# Patient Record
Sex: Female | Born: 1960 | Race: White | Hispanic: No | Marital: Married | Smoking: Never smoker
Health system: Southern US, Community
[De-identification: ages and names within clinical notes are randomized; demographics above are authoritative.]

## PROBLEM LIST (undated history)

## (undated) DIAGNOSIS — J309 Allergic rhinitis, unspecified: Secondary | ICD-10-CM

## (undated) DIAGNOSIS — T7840XA Allergy, unspecified, initial encounter: Secondary | ICD-10-CM

## (undated) DIAGNOSIS — E119 Type 2 diabetes mellitus without complications: Secondary | ICD-10-CM

## (undated) HISTORY — DX: Type 2 diabetes mellitus without complications: E11.9

## (undated) HISTORY — DX: Allergic rhinitis, unspecified: J30.9

## (undated) HISTORY — PX: POLYPECTOMY: SHX149

## (undated) HISTORY — PX: COLONOSCOPY: SHX174

## (undated) HISTORY — DX: Allergy, unspecified, initial encounter: T78.40XA

---

## 1999-03-14 ENCOUNTER — Other Ambulatory Visit: Admission: RE | Admit: 1999-03-14 | Discharge: 1999-03-14 | Payer: Self-pay | Admitting: Obstetrics and Gynecology

## 2001-05-10 ENCOUNTER — Other Ambulatory Visit: Admission: RE | Admit: 2001-05-10 | Discharge: 2001-05-10 | Payer: Self-pay | Admitting: Obstetrics and Gynecology

## 2002-06-27 ENCOUNTER — Other Ambulatory Visit: Admission: RE | Admit: 2002-06-27 | Discharge: 2002-06-27 | Payer: Self-pay | Admitting: Obstetrics and Gynecology

## 2003-07-06 ENCOUNTER — Other Ambulatory Visit: Admission: RE | Admit: 2003-07-06 | Discharge: 2003-07-06 | Payer: Self-pay | Admitting: Obstetrics and Gynecology

## 2004-08-14 ENCOUNTER — Other Ambulatory Visit: Admission: RE | Admit: 2004-08-14 | Discharge: 2004-08-14 | Payer: Self-pay | Admitting: Obstetrics and Gynecology

## 2005-09-05 ENCOUNTER — Ambulatory Visit (HOSPITAL_COMMUNITY): Admission: RE | Admit: 2005-09-05 | Discharge: 2005-09-05 | Payer: Self-pay | Admitting: Obstetrics and Gynecology

## 2006-09-07 ENCOUNTER — Ambulatory Visit (HOSPITAL_COMMUNITY): Admission: RE | Admit: 2006-09-07 | Discharge: 2006-09-07 | Payer: Self-pay | Admitting: Obstetrics and Gynecology

## 2007-09-10 ENCOUNTER — Ambulatory Visit (HOSPITAL_COMMUNITY): Admission: RE | Admit: 2007-09-10 | Discharge: 2007-09-10 | Payer: Self-pay | Admitting: Obstetrics and Gynecology

## 2008-09-15 ENCOUNTER — Ambulatory Visit (HOSPITAL_COMMUNITY): Admission: RE | Admit: 2008-09-15 | Discharge: 2008-09-15 | Payer: Self-pay | Admitting: Obstetrics and Gynecology

## 2008-09-19 ENCOUNTER — Encounter: Admission: RE | Admit: 2008-09-19 | Discharge: 2008-09-19 | Payer: Self-pay | Admitting: Obstetrics and Gynecology

## 2013-01-11 ENCOUNTER — Other Ambulatory Visit: Payer: Self-pay | Admitting: Obstetrics and Gynecology

## 2013-01-11 DIAGNOSIS — Z803 Family history of malignant neoplasm of breast: Secondary | ICD-10-CM

## 2015-01-11 ENCOUNTER — Other Ambulatory Visit: Payer: Self-pay | Admitting: Obstetrics and Gynecology

## 2015-01-11 DIAGNOSIS — R928 Other abnormal and inconclusive findings on diagnostic imaging of breast: Secondary | ICD-10-CM

## 2015-01-19 ENCOUNTER — Other Ambulatory Visit: Payer: Self-pay | Admitting: Obstetrics and Gynecology

## 2015-01-19 ENCOUNTER — Ambulatory Visit
Admission: RE | Admit: 2015-01-19 | Discharge: 2015-01-19 | Disposition: A | Discharge status: Home / Self Care | Payer: BLUE CROSS/BLUE SHIELD | Source: Ambulatory Visit | Attending: Obstetrics and Gynecology | Admitting: Obstetrics and Gynecology

## 2015-01-19 DIAGNOSIS — N6310 Unspecified lump in the right breast, unspecified quadrant: Secondary | ICD-10-CM

## 2015-01-19 DIAGNOSIS — R928 Other abnormal and inconclusive findings on diagnostic imaging of breast: Secondary | ICD-10-CM

## 2015-01-22 ENCOUNTER — Other Ambulatory Visit: Payer: Self-pay | Admitting: Obstetrics and Gynecology

## 2015-01-22 ENCOUNTER — Encounter: Payer: Self-pay | Admitting: Gastroenterology

## 2015-01-22 ENCOUNTER — Ambulatory Visit
Admission: RE | Admit: 2015-01-22 | Discharge: 2015-01-22 | Disposition: A | Discharge status: Home / Self Care | Payer: BLUE CROSS/BLUE SHIELD | Source: Ambulatory Visit | Attending: Obstetrics and Gynecology | Admitting: Obstetrics and Gynecology

## 2015-01-22 DIAGNOSIS — N6310 Unspecified lump in the right breast, unspecified quadrant: Secondary | ICD-10-CM

## 2015-02-08 DIAGNOSIS — J309 Allergic rhinitis, unspecified: Principal | ICD-10-CM

## 2015-02-08 DIAGNOSIS — H1010 Acute atopic conjunctivitis, unspecified eye: Secondary | ICD-10-CM | POA: Insufficient documentation

## 2015-08-01 ENCOUNTER — Ambulatory Visit (INDEPENDENT_AMBULATORY_CARE_PROVIDER_SITE_OTHER): Payer: BLUE CROSS/BLUE SHIELD | Admitting: Allergy and Immunology

## 2015-08-01 ENCOUNTER — Encounter: Payer: Self-pay | Admitting: Allergy and Immunology

## 2015-08-01 VITALS — BP 114/68 | HR 84 | Resp 16 | Ht 65.63 in | Wt 141.8 lb

## 2015-08-01 DIAGNOSIS — J309 Allergic rhinitis, unspecified: Secondary | ICD-10-CM

## 2015-08-01 DIAGNOSIS — Z91018 Allergy to other foods: Secondary | ICD-10-CM | POA: Diagnosis not present

## 2015-08-01 DIAGNOSIS — H1010 Acute atopic conjunctivitis, unspecified eye: Secondary | ICD-10-CM

## 2015-08-01 DIAGNOSIS — T781XXD Other adverse food reactions, not elsewhere classified, subsequent encounter: Secondary | ICD-10-CM | POA: Diagnosis not present

## 2015-08-01 MED ORDER — OLOPATADINE HCL 0.7 % OP SOLN: 1.0000 [drp] | Freq: Every day | OPHTHALMIC | Status: DC | PRN

## 2015-08-01 MED ORDER — MONTELUKAST SODIUM 10 MG PO TABS: ORAL_TABLET | ORAL | Status: AC

## 2015-08-01 MED ORDER — EPINEPHRINE 0.3 MG/0.3ML IJ SOAJ: INTRAMUSCULAR | Status: AC

## 2015-08-01 NOTE — Patient Instructions (Signed)
  1. Continue EpiPen if needed  2. Continue montelukast 10 mg one tablet once a day if needed  3. Continue Zyrtec 10 tablet once a day if needed  4. Continue Pazeo one drop each eye once a day if needed  5. Further evaluation?  6. Return to clinic in 1 year or earlier if problem

## 2015-08-01 NOTE — Progress Notes (Signed)
Follow-up Note  Referring Provider: No ref. provider found Primary Provider: Simone CuriaLEE,KEUNG, MD Date of Office Visit: 08/01/2015  Subjective:   Tina Green (DOB: 02-Nov-1960) is a 55 y.o. female [facility] in re-evaluation of the following:  HPI Comments: Tina Green returns to this clinic in reevaluation of her apparent alpha gal syndrome manifested as acute allergic reaction with urticaria and her allergic rhinoconjunctivitis. It is been 1 year since I've seen her in this clinic.  While avoiding mammal inconsistently using Zyrtec and montelukast she is not had any allergic reactions. Her nose and eyes are doing relatively well although on occasion she does develop some irritation of her eyes especially at work. She thinks that this may be secondary to formaldehyde exposure. She is not had a tremendous amount of problems with her nose or eyes regarding pollen exposure. She does relate a history of developing problems with swollen lips and some throat irritation when she eats certain foods like tomatoes and strawberries.  In March 2016 Tina Green had an alpha gal titer checked which was relatively low at 0.67 KU/ML. She [facility] which was less than 1.0 KU/ML.     Medication List           cetirizine 10 MG tablet  Commonly known as:  ZYRTEC  Take 10 mg by mouth daily.     EPIPEN 2-PAK 0.3 mg/0.3 mL Soaj injection  Generic drug:  EPINEPHrine  Inject 0.3 mg into the muscle once.     montelukast 10 MG tablet  Commonly known as:  SINGULAIR  Take 10 mg by mouth daily.     NUTRITIONAL SUPPLEMENTS PO  Take by mouth.        Past Medical History  Diagnosis Date  . Allergic rhinitis     History reviewed. No pertinent past surgical history.  Allergies  Allergen Reactions  . Penicillins     Review of systems negative except as noted in HPI / PMHx or noted below:  Review of  Systems  Constitutional: Negative.   HENT: Negative.   Eyes: Negative.   Respiratory: Negative.   Cardiovascular: Negative.   Gastrointestinal: Negative.   Genitourinary: Negative.   Musculoskeletal: Negative.   Skin: Negative.   Neurological: Negative.   Endo/Heme/Allergies: Negative.   Psychiatric/Behavioral: Negative.      Objective:   Filed Vitals:   08/01/15 1612  BP: 114/68  Pulse: 84  Resp: 16   Height: 5' 5.63" (166.7 cm)  Weight: 141 lb 12.1 oz (64.3 kg)   Physical Exam  Constitutional: She is well-developed, well-nourished, and in no distress.  HENT:  Head: Normocephalic.  Right Ear: Tympanic membrane, external ear and ear canal normal.  Left Ear: Tympanic membrane, external ear and ear canal normal.  Nose: Nose normal. No mucosal edema or rhinorrhea.  Mouth/Throat: Uvula is midline, oropharynx is clear and moist and mucous membranes are normal. No oropharyngeal exudate.  Eyes: Conjunctivae are normal.  Neck: Trachea normal. No tracheal tenderness present. No tracheal deviation present. No thyromegaly present.  Cardiovascular: Normal rate, regular rhythm, S1 normal, S2 normal and normal heart sounds.   No murmur heard. Pulmonary/Chest: Breath sounds normal. No stridor. No respiratory distress. She has no wheezes. She has no rales.  Musculoskeletal: She exhibits no edema.  Lymphadenopathy:       Head (right side): No tonsillar adenopathy present.       Head (left side):  No tonsillar adenopathy present.    She has no cervical adenopathy.    She has no axillary adenopathy.  Neurological: She is alert. Gait normal.  Skin: No rash noted. She is not diaphoretic. No erythema. Nails show no clubbing.  Psychiatric: Mood and affect normal.    Diagnostics: None   Assessment and Plan:   1. Food allergy   2. Allergic rhinoconjunctivitis   3. Oral allergy syndrome, subsequent encounter     1. Continue EpiPen if needed  2. Continue montelukast 10 mg one tablet  once a day if needed  3. Continue Zyrtec 10 tablet once a day if needed  4. Continue Pazeo one drop each eye once a day if needed  5. Further evaluation?  6. Return to clinic in 1 year or earlier if problem  Berneice is doing well and she has several options that she moves forward. First, because her alpha gal titer is very low she could consider eating mammal is a test. He would probably not make sense to have her undergo a oral food challenge in the clinic because her reaction will be delayed by several hours after exposure if indeed she still has alpha gal syndrome. I made a recommendation that if she considers eating mammal she should do it before midday and have an EpiPen ready and have someone available to address any allergic reaction that may occur. She can use her montelukast and Zyrtec as needed and see if she will require this as she moves forward through this upcoming springtime season. Given her previous skin tests results I suspect she probably will require montelukast and Zyrtec during this pollen seasons of the year. She has a history that sounds as though it may be oral pollinosis syndrome and I gave her some literature on this form of allergy during today's visit. We'll see her back in this clinic in 1 year or earlier if there is a problem.   Laurette Schimke, MD [facility]

## 2015-08-02 ENCOUNTER — Other Ambulatory Visit: Payer: Self-pay | Admitting: *Deleted

## 2015-08-02 MED ORDER — OLOPATADINE HCL 0.1 % OP SOLN: OPHTHALMIC | Status: AC

## 2015-08-02 NOTE — Telephone Encounter (Signed)
PATANOL EYE DROPS SENT TO PHARMACY SINCE PAZEO IS NOT COVERED

## 2015-11-30 DIAGNOSIS — R739 Hyperglycemia, unspecified: Secondary | ICD-10-CM | POA: Diagnosis not present

## 2015-11-30 DIAGNOSIS — D508 Other iron deficiency anemias: Secondary | ICD-10-CM | POA: Diagnosis not present

## 2015-12-04 DIAGNOSIS — L820 Inflamed seborrheic keratosis: Secondary | ICD-10-CM | POA: Diagnosis not present

## 2016-01-09 DIAGNOSIS — Z1231 Encounter for screening mammogram for malignant neoplasm of breast: Secondary | ICD-10-CM | POA: Diagnosis not present

## 2016-01-09 DIAGNOSIS — Z01419 Encounter for gynecological examination (general) (routine) without abnormal findings: Secondary | ICD-10-CM | POA: Diagnosis not present

## 2016-01-09 DIAGNOSIS — Z6821 Body mass index (BMI) 21.0-21.9, adult: Secondary | ICD-10-CM | POA: Diagnosis not present

## 2016-02-07 DIAGNOSIS — Z23 Encounter for immunization: Secondary | ICD-10-CM | POA: Diagnosis not present

## 2017-01-19 DIAGNOSIS — Z6821 Body mass index (BMI) 21.0-21.9, adult: Secondary | ICD-10-CM | POA: Diagnosis not present

## 2017-01-19 DIAGNOSIS — Z13820 Encounter for screening for osteoporosis: Secondary | ICD-10-CM | POA: Diagnosis not present

## 2017-01-19 DIAGNOSIS — Z01419 Encounter for gynecological examination (general) (routine) without abnormal findings: Secondary | ICD-10-CM | POA: Diagnosis not present

## 2017-01-19 DIAGNOSIS — Z1231 Encounter for screening mammogram for malignant neoplasm of breast: Secondary | ICD-10-CM | POA: Diagnosis not present

## 2017-01-21 DIAGNOSIS — Z23 Encounter for immunization: Secondary | ICD-10-CM | POA: Diagnosis not present

## 2017-05-12 IMAGING — MG MM DIAG BREAST TOMO UNI RIGHT
4 series · 4 of 12 positions shown · non-contrast
Comparison: Previous exams including recent screening mammogram
dated 01/04/2015.

ADDENDUM:
Under exam heading, should read:  Ultrasound RIGHT breast
CLINICAL DATA: Patient returns today to evaluate a possible right
breast mass identified on recent screening mammogram.

EXAM:
DIGITAL DIAGNOSTIC RIGHT MAMMOGRAM WITH 3D TOMOSYNTHESIS WITH CAD
ULTRASOUND Three BREAST

[R ML]
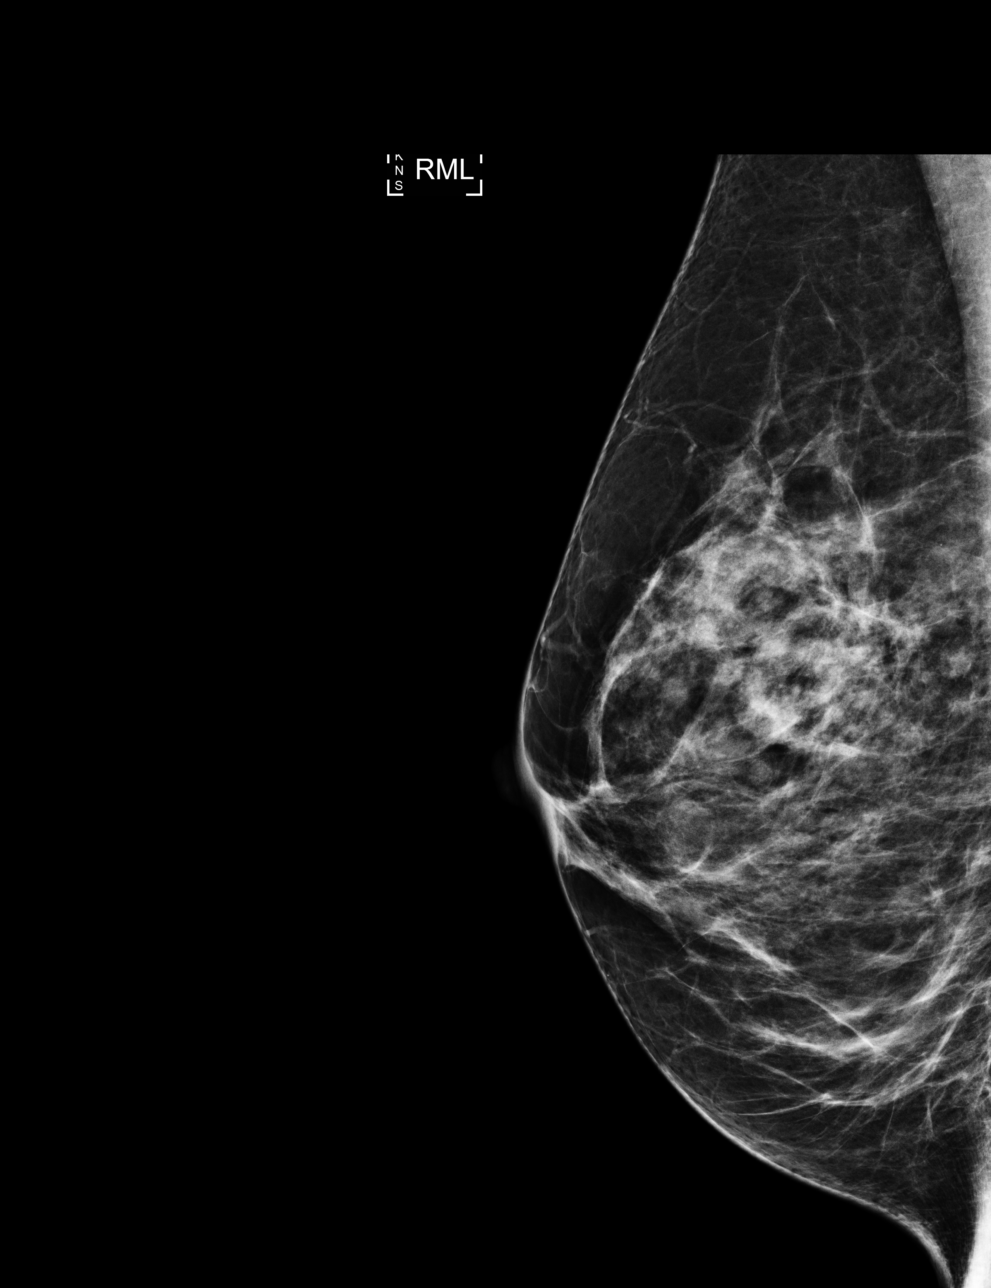

[R MLO]
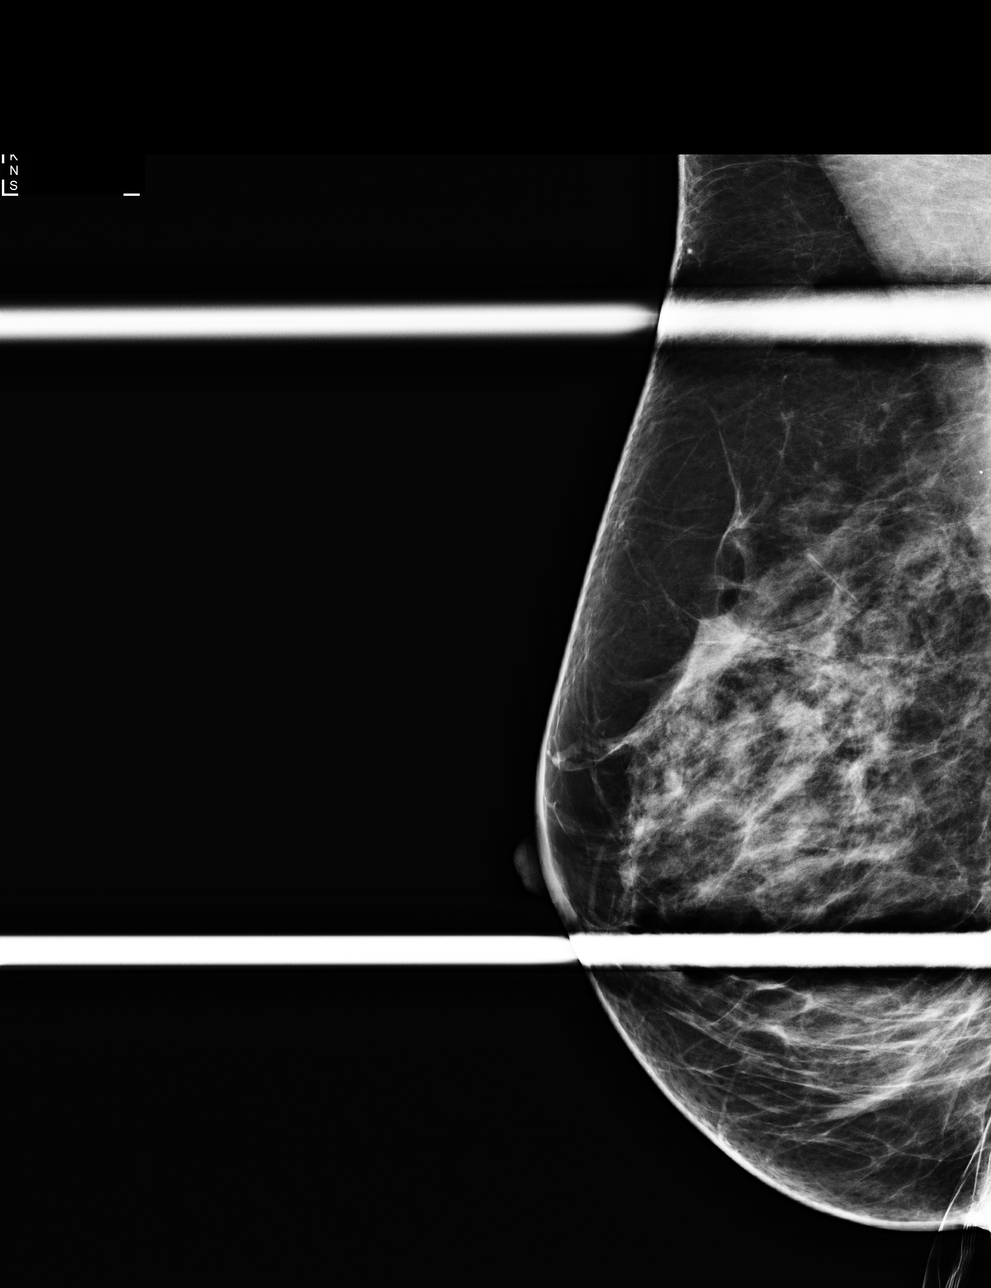

[R ML tomo · tomo slice 31/60.0]
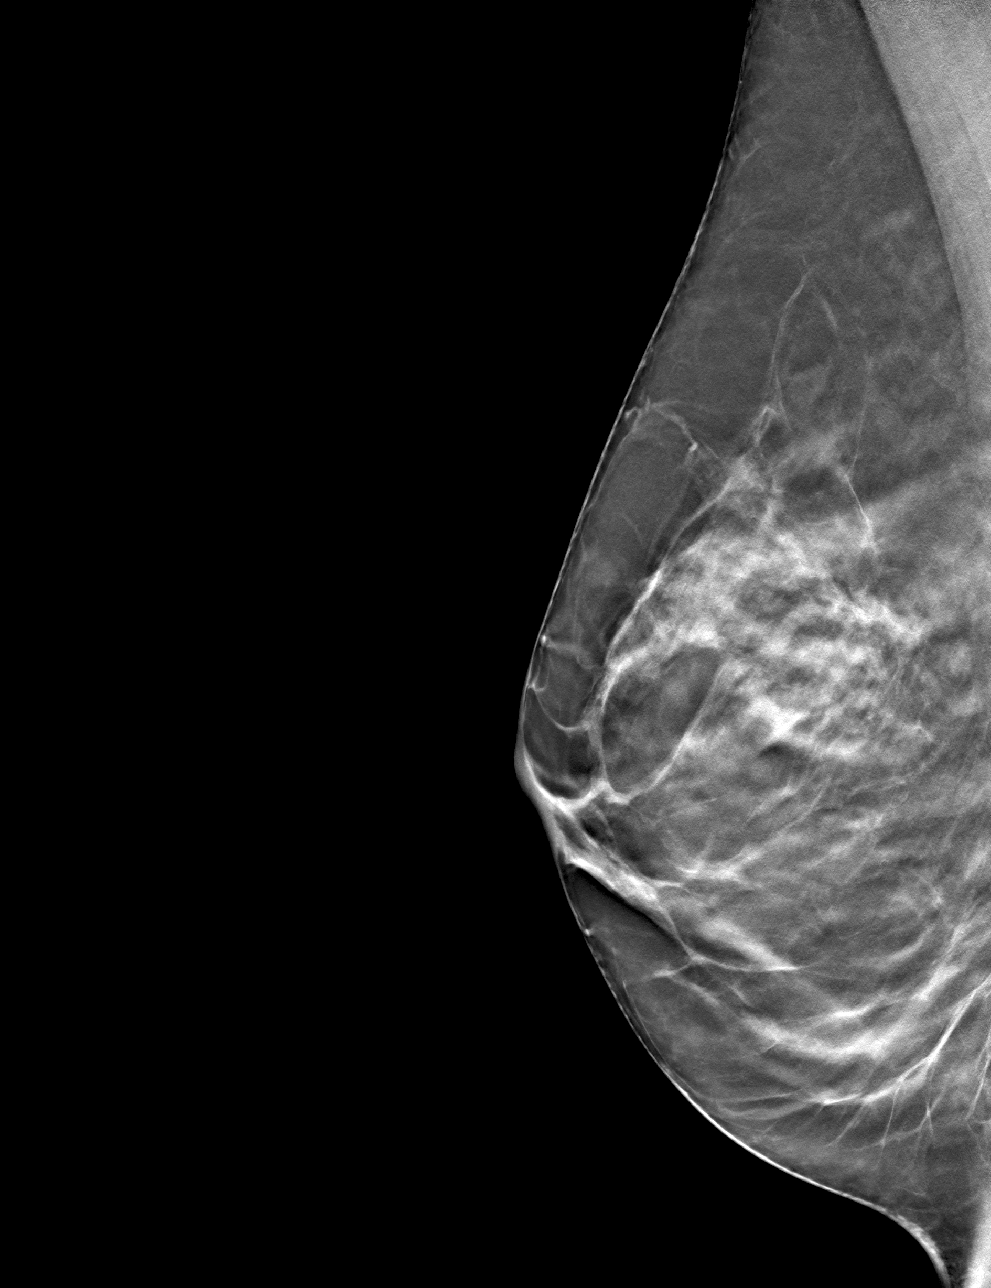

[R MLO tomo · tomo slice 35/70.0]
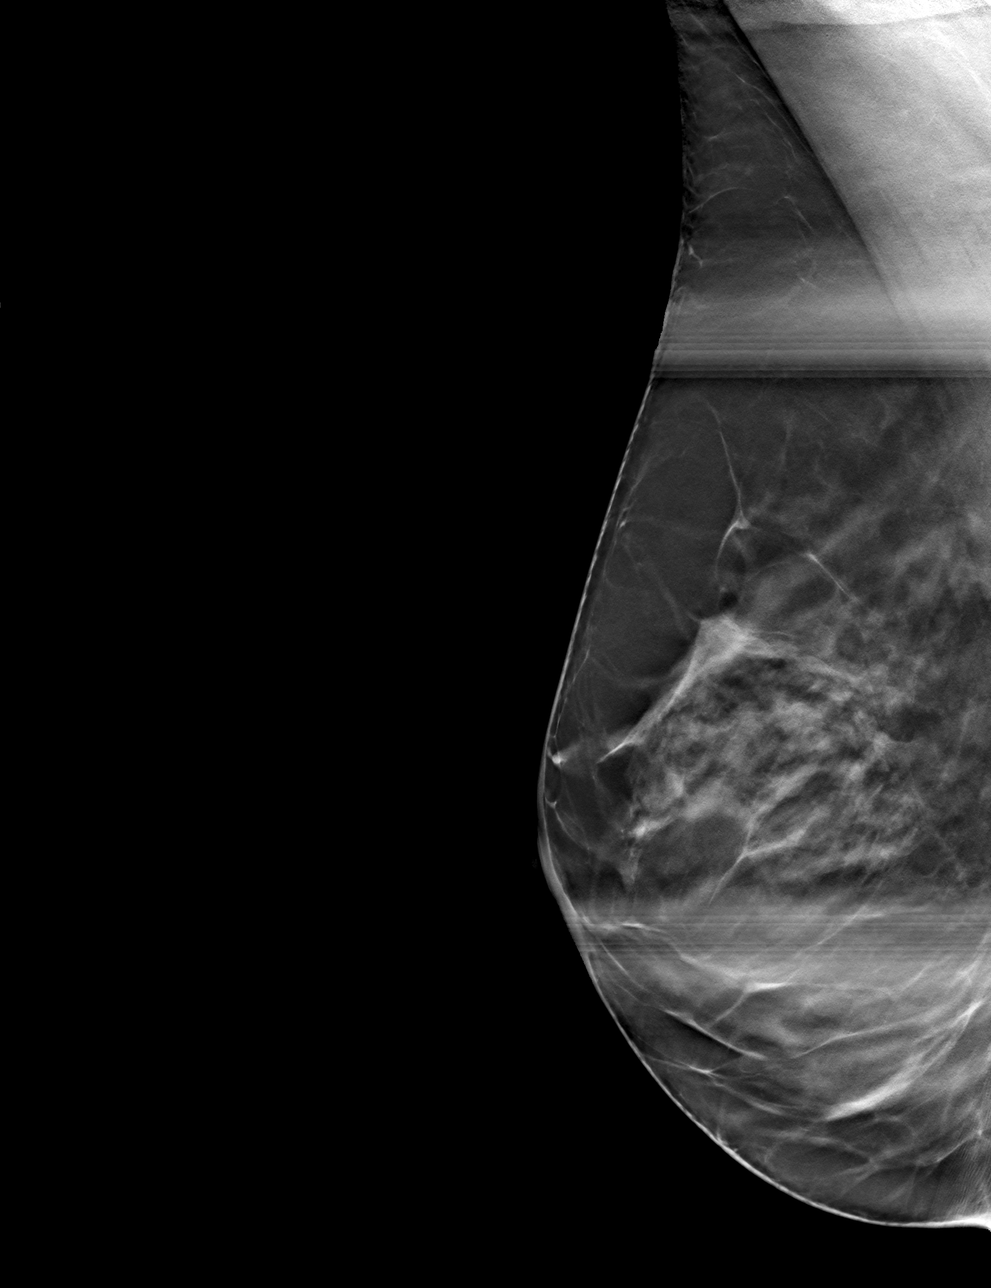

[4 of 12 positions shown; findings below may reference images not displayed]

ACR Breast Density Category c: The breast tissue is heterogeneously
dense, which may obscure small masses.
FINDINGS: On today's additional views with spot compression and tomosynthesis,
there is a small oval circumscribed mass confirmed within the
upper-outer quadrant of the right breast, far lateral, with a fatty
hilum compatible with benign lymph node. In retrospect, this
mass/lymph node is stable compared to the previous mammogram of
01/02/2014. This area of the upper-outer quadrant was obscured by
superimposed fibroglandular tissues on earlier mammograms.

Mammographic images were processed with CAD.

Targeted ultrasound is performed, showing a benign intramammary
lymph node within the right breast at the 9:30 o'clock axis, 8 cm
from the nipple, at posterior depth, measuring 5 mm, corresponding
to the mammographic finding. No suspicious solid or cystic masses
are identified within the upper-outer quadrant of the right breast.
IMPRESSION: Benign intramammary lymph node within the right breast, upper outer
quadrant, corresponding to the mammographic finding.

No mammographic or sonographic evidence of malignancy.

RECOMMENDATION:
Screening mammogram in one year.(Code:[id number]-R-VG9)

I have discussed the findings and recommendations with the patient.
Results were also provided in writing at the conclusion of the
visit. If applicable, a reminder letter will be sent to the patient
regarding the next appointment.

BI-RADS CATEGORY  2: Benign.

## 2017-06-24 DIAGNOSIS — N951 Menopausal and female climacteric states: Secondary | ICD-10-CM | POA: Diagnosis not present

## 2017-06-24 DIAGNOSIS — E611 Iron deficiency: Secondary | ICD-10-CM | POA: Diagnosis not present

## 2017-06-24 DIAGNOSIS — Z13220 Encounter for screening for lipoid disorders: Secondary | ICD-10-CM | POA: Diagnosis not present

## 2017-06-24 DIAGNOSIS — R7303 Prediabetes: Secondary | ICD-10-CM | POA: Diagnosis not present

## 2017-06-26 DIAGNOSIS — N951 Menopausal and female climacteric states: Secondary | ICD-10-CM | POA: Diagnosis not present

## 2017-10-08 DIAGNOSIS — L820 Inflamed seborrheic keratosis: Secondary | ICD-10-CM | POA: Diagnosis not present

## 2017-10-08 DIAGNOSIS — D485 Neoplasm of uncertain behavior of skin: Secondary | ICD-10-CM | POA: Diagnosis not present

## 2017-11-10 DIAGNOSIS — C44519 Basal cell carcinoma of skin of other part of trunk: Secondary | ICD-10-CM | POA: Diagnosis not present

## 2017-12-23 DIAGNOSIS — N951 Menopausal and female climacteric states: Secondary | ICD-10-CM | POA: Diagnosis not present

## 2017-12-23 DIAGNOSIS — Z6822 Body mass index (BMI) 22.0-22.9, adult: Secondary | ICD-10-CM | POA: Diagnosis not present

## 2017-12-29 ENCOUNTER — Encounter: Payer: Self-pay | Admitting: Gastroenterology

## 2018-01-20 DIAGNOSIS — Z6822 Body mass index (BMI) 22.0-22.9, adult: Secondary | ICD-10-CM | POA: Diagnosis not present

## 2018-01-20 DIAGNOSIS — Z1239 Encounter for other screening for malignant neoplasm of breast: Secondary | ICD-10-CM | POA: Diagnosis not present

## 2018-01-20 DIAGNOSIS — Z01419 Encounter for gynecological examination (general) (routine) without abnormal findings: Secondary | ICD-10-CM | POA: Diagnosis not present

## 2018-01-20 DIAGNOSIS — Z1231 Encounter for screening mammogram for malignant neoplasm of breast: Secondary | ICD-10-CM | POA: Diagnosis not present

## 2018-01-26 ENCOUNTER — Encounter: Payer: Self-pay | Admitting: Gastroenterology

## 2018-01-27 DIAGNOSIS — Z23 Encounter for immunization: Secondary | ICD-10-CM | POA: Diagnosis not present

## 2018-02-24 ENCOUNTER — Ambulatory Visit (AMBULATORY_SURGERY_CENTER): Payer: Self-pay | Admitting: *Deleted

## 2018-02-24 ENCOUNTER — Other Ambulatory Visit: Payer: Self-pay

## 2018-02-24 VITALS — Ht 67.0 in | Wt 141.1 lb

## 2018-02-24 DIAGNOSIS — Z86010 Personal history of colonic polyps: Secondary | ICD-10-CM

## 2018-02-24 DIAGNOSIS — Z800 Family history of malignant neoplasm of digestive organs: Secondary | ICD-10-CM

## 2018-02-24 MED ORDER — SOD PICOSULFATE-MAG OX-CIT ACD 10-3.5-12 MG-GM -GM/160ML PO SOLN: 1.0000 | ORAL | 0 refills | Status: AC

## 2018-02-24 NOTE — Progress Notes (Signed)
No egg or soy allergy known to patient  No issues with past sedation with any surgeries  or procedures, no intubation problems  No diet pills per patient No home 02 use per patient  No blood thinners per patient.  Patient stating she use magnesium q night and has regular BM No A fib or A flutter  EMMI video offered and declined.

## 2018-02-25 ENCOUNTER — Encounter: Payer: Self-pay | Admitting: Gastroenterology

## 2018-03-09 ENCOUNTER — Encounter: Payer: Self-pay | Admitting: Gastroenterology

## 2018-05-14 DIAGNOSIS — Z1211 Encounter for screening for malignant neoplasm of colon: Secondary | ICD-10-CM | POA: Diagnosis not present

## 2018-05-14 DIAGNOSIS — Z86010 Personal history of colonic polyps: Secondary | ICD-10-CM | POA: Diagnosis not present

## 2018-05-14 DIAGNOSIS — Z800 Family history of malignant neoplasm of digestive organs: Secondary | ICD-10-CM | POA: Diagnosis not present

## 2018-05-18 DIAGNOSIS — Z6822 Body mass index (BMI) 22.0-22.9, adult: Secondary | ICD-10-CM | POA: Diagnosis not present

## 2018-05-18 DIAGNOSIS — T148XXA Other injury of unspecified body region, initial encounter: Secondary | ICD-10-CM | POA: Diagnosis not present

## 2018-05-27 DIAGNOSIS — R300 Dysuria: Secondary | ICD-10-CM | POA: Diagnosis not present

## 2018-05-27 DIAGNOSIS — Z6822 Body mass index (BMI) 22.0-22.9, adult: Secondary | ICD-10-CM | POA: Diagnosis not present

## 2018-06-24 DIAGNOSIS — Z6822 Body mass index (BMI) 22.0-22.9, adult: Secondary | ICD-10-CM | POA: Diagnosis not present

## 2018-06-24 DIAGNOSIS — N951 Menopausal and female climacteric states: Secondary | ICD-10-CM | POA: Diagnosis not present

## 2018-09-16 ENCOUNTER — Telehealth: Payer: Self-pay | Admitting: *Deleted

## 2018-09-16 NOTE — Telephone Encounter (Signed)
Botox should be delivered on 09/22/2018.

## 2018-12-21 DIAGNOSIS — Z6821 Body mass index (BMI) 21.0-21.9, adult: Secondary | ICD-10-CM | POA: Diagnosis not present

## 2018-12-21 DIAGNOSIS — Z131 Encounter for screening for diabetes mellitus: Secondary | ICD-10-CM | POA: Diagnosis not present

## 2018-12-21 DIAGNOSIS — Z13220 Encounter for screening for lipoid disorders: Secondary | ICD-10-CM | POA: Diagnosis not present

## 2018-12-21 DIAGNOSIS — N951 Menopausal and female climacteric states: Secondary | ICD-10-CM | POA: Diagnosis not present

## 2019-01-19 DIAGNOSIS — Z23 Encounter for immunization: Secondary | ICD-10-CM | POA: Diagnosis not present

## 2019-01-27 DIAGNOSIS — Z1231 Encounter for screening mammogram for malignant neoplasm of breast: Secondary | ICD-10-CM | POA: Diagnosis not present

## 2019-01-27 DIAGNOSIS — Z6821 Body mass index (BMI) 21.0-21.9, adult: Secondary | ICD-10-CM | POA: Diagnosis not present

## 2019-01-27 DIAGNOSIS — Z01419 Encounter for gynecological examination (general) (routine) without abnormal findings: Secondary | ICD-10-CM | POA: Diagnosis not present

## 2019-01-27 DIAGNOSIS — Z13820 Encounter for screening for osteoporosis: Secondary | ICD-10-CM | POA: Diagnosis not present

## 2019-06-21 DIAGNOSIS — R7303 Prediabetes: Secondary | ICD-10-CM | POA: Diagnosis not present

## 2019-06-21 DIAGNOSIS — N951 Menopausal and female climacteric states: Secondary | ICD-10-CM | POA: Diagnosis not present

## 2019-06-21 DIAGNOSIS — Z6821 Body mass index (BMI) 21.0-21.9, adult: Secondary | ICD-10-CM | POA: Diagnosis not present

## 2019-12-20 DIAGNOSIS — N951 Menopausal and female climacteric states: Secondary | ICD-10-CM | POA: Diagnosis not present

## 2020-01-18 DIAGNOSIS — Z23 Encounter for immunization: Secondary | ICD-10-CM | POA: Diagnosis not present

## 2020-02-14 DIAGNOSIS — Z01419 Encounter for gynecological examination (general) (routine) without abnormal findings: Secondary | ICD-10-CM | POA: Diagnosis not present

## 2020-02-14 DIAGNOSIS — Z1231 Encounter for screening mammogram for malignant neoplasm of breast: Secondary | ICD-10-CM | POA: Diagnosis not present

## 2020-02-14 DIAGNOSIS — Z6821 Body mass index (BMI) 21.0-21.9, adult: Secondary | ICD-10-CM | POA: Diagnosis not present

## 2020-08-06 DIAGNOSIS — N951 Menopausal and female climacteric states: Secondary | ICD-10-CM | POA: Diagnosis not present

## 2020-08-06 DIAGNOSIS — R7303 Prediabetes: Secondary | ICD-10-CM | POA: Diagnosis not present

## 2021-01-14 DIAGNOSIS — Z6822 Body mass index (BMI) 22.0-22.9, adult: Secondary | ICD-10-CM | POA: Diagnosis not present

## 2021-01-14 DIAGNOSIS — N951 Menopausal and female climacteric states: Secondary | ICD-10-CM | POA: Diagnosis not present

## 2021-01-16 DIAGNOSIS — Z23 Encounter for immunization: Secondary | ICD-10-CM | POA: Diagnosis not present

## 2021-02-06 DIAGNOSIS — L820 Inflamed seborrheic keratosis: Secondary | ICD-10-CM | POA: Diagnosis not present

## 2021-02-18 DIAGNOSIS — Z1231 Encounter for screening mammogram for malignant neoplasm of breast: Secondary | ICD-10-CM | POA: Diagnosis not present

## 2021-02-18 DIAGNOSIS — Z01419 Encounter for gynecological examination (general) (routine) without abnormal findings: Secondary | ICD-10-CM | POA: Diagnosis not present

## 2021-02-18 DIAGNOSIS — Z13820 Encounter for screening for osteoporosis: Secondary | ICD-10-CM | POA: Diagnosis not present

## 2021-02-18 DIAGNOSIS — Z6822 Body mass index (BMI) 22.0-22.9, adult: Secondary | ICD-10-CM | POA: Diagnosis not present

## 2021-07-17 DIAGNOSIS — R7303 Prediabetes: Secondary | ICD-10-CM | POA: Diagnosis not present

## 2021-07-17 DIAGNOSIS — E785 Hyperlipidemia, unspecified: Secondary | ICD-10-CM | POA: Diagnosis not present

## 2021-07-17 DIAGNOSIS — N951 Menopausal and female climacteric states: Secondary | ICD-10-CM | POA: Diagnosis not present

## 2021-07-17 DIAGNOSIS — Z6822 Body mass index (BMI) 22.0-22.9, adult: Secondary | ICD-10-CM | POA: Diagnosis not present

## 2021-07-17 DIAGNOSIS — Z79899 Other long term (current) drug therapy: Secondary | ICD-10-CM | POA: Diagnosis not present

## 2022-01-21 DIAGNOSIS — Z23 Encounter for immunization: Secondary | ICD-10-CM | POA: Diagnosis not present

## 2022-01-30 DIAGNOSIS — R7303 Prediabetes: Secondary | ICD-10-CM | POA: Diagnosis not present

## 2022-01-30 DIAGNOSIS — Z79899 Other long term (current) drug therapy: Secondary | ICD-10-CM | POA: Diagnosis not present

## 2022-01-30 DIAGNOSIS — N951 Menopausal and female climacteric states: Secondary | ICD-10-CM | POA: Diagnosis not present

## 2022-01-30 DIAGNOSIS — E785 Hyperlipidemia, unspecified: Secondary | ICD-10-CM | POA: Diagnosis not present

## 2022-02-26 DIAGNOSIS — Z01419 Encounter for gynecological examination (general) (routine) without abnormal findings: Secondary | ICD-10-CM | POA: Diagnosis not present

## 2022-02-26 DIAGNOSIS — Z1151 Encounter for screening for human papillomavirus (HPV): Secondary | ICD-10-CM | POA: Diagnosis not present

## 2022-02-26 DIAGNOSIS — Z124 Encounter for screening for malignant neoplasm of cervix: Secondary | ICD-10-CM | POA: Diagnosis not present

## 2022-02-26 DIAGNOSIS — Z1231 Encounter for screening mammogram for malignant neoplasm of breast: Secondary | ICD-10-CM | POA: Diagnosis not present

## 2022-02-26 DIAGNOSIS — Z6822 Body mass index (BMI) 22.0-22.9, adult: Secondary | ICD-10-CM | POA: Diagnosis not present

## 2022-07-16 DIAGNOSIS — M7061 Trochanteric bursitis, right hip: Secondary | ICD-10-CM | POA: Diagnosis not present

## 2022-08-11 DIAGNOSIS — M5451 Vertebrogenic low back pain: Secondary | ICD-10-CM | POA: Diagnosis not present

## 2022-08-11 DIAGNOSIS — M9905 Segmental and somatic dysfunction of pelvic region: Secondary | ICD-10-CM | POA: Diagnosis not present

## 2022-08-11 DIAGNOSIS — M9903 Segmental and somatic dysfunction of lumbar region: Secondary | ICD-10-CM | POA: Diagnosis not present

## 2022-08-11 DIAGNOSIS — M9902 Segmental and somatic dysfunction of thoracic region: Secondary | ICD-10-CM | POA: Diagnosis not present

## 2022-11-06 DIAGNOSIS — S50861A Insect bite (nonvenomous) of right forearm, initial encounter: Secondary | ICD-10-CM | POA: Diagnosis not present

## 2023-01-28 DIAGNOSIS — N951 Menopausal and female climacteric states: Secondary | ICD-10-CM | POA: Diagnosis not present

## 2023-01-28 DIAGNOSIS — R7303 Prediabetes: Secondary | ICD-10-CM | POA: Diagnosis not present

## 2023-01-28 DIAGNOSIS — M25551 Pain in right hip: Secondary | ICD-10-CM | POA: Diagnosis not present

## 2023-01-28 DIAGNOSIS — E785 Hyperlipidemia, unspecified: Secondary | ICD-10-CM | POA: Diagnosis not present

## 2023-01-29 DIAGNOSIS — Z23 Encounter for immunization: Secondary | ICD-10-CM | POA: Diagnosis not present

## 2023-04-06 DIAGNOSIS — Z01419 Encounter for gynecological examination (general) (routine) without abnormal findings: Secondary | ICD-10-CM | POA: Diagnosis not present

## 2023-04-06 DIAGNOSIS — Z13820 Encounter for screening for osteoporosis: Secondary | ICD-10-CM | POA: Diagnosis not present

## 2023-04-06 DIAGNOSIS — Z1231 Encounter for screening mammogram for malignant neoplasm of breast: Secondary | ICD-10-CM | POA: Diagnosis not present

## 2023-04-06 DIAGNOSIS — Z6821 Body mass index (BMI) 21.0-21.9, adult: Secondary | ICD-10-CM | POA: Diagnosis not present

## 2023-05-14 DIAGNOSIS — H1013 Acute atopic conjunctivitis, bilateral: Secondary | ICD-10-CM | POA: Diagnosis not present

## 2023-05-26 DIAGNOSIS — C44519 Basal cell carcinoma of skin of other part of trunk: Secondary | ICD-10-CM | POA: Diagnosis not present

## 2023-06-01 DIAGNOSIS — K649 Unspecified hemorrhoids: Secondary | ICD-10-CM | POA: Diagnosis not present

## 2023-06-01 DIAGNOSIS — Z01818 Encounter for other preprocedural examination: Secondary | ICD-10-CM | POA: Diagnosis not present

## 2023-08-28 DIAGNOSIS — K635 Polyp of colon: Secondary | ICD-10-CM | POA: Diagnosis not present

## 2023-08-28 DIAGNOSIS — Z880 Allergy status to penicillin: Secondary | ICD-10-CM | POA: Diagnosis not present

## 2023-08-28 DIAGNOSIS — Z800 Family history of malignant neoplasm of digestive organs: Secondary | ICD-10-CM | POA: Diagnosis not present

## 2023-08-28 DIAGNOSIS — K644 Residual hemorrhoidal skin tags: Secondary | ICD-10-CM | POA: Diagnosis not present

## 2023-08-28 DIAGNOSIS — D122 Benign neoplasm of ascending colon: Secondary | ICD-10-CM | POA: Diagnosis not present

## 2023-08-28 DIAGNOSIS — D126 Benign neoplasm of colon, unspecified: Secondary | ICD-10-CM | POA: Diagnosis not present

## 2023-08-28 DIAGNOSIS — Z91040 Latex allergy status: Secondary | ICD-10-CM | POA: Diagnosis not present

## 2023-08-28 DIAGNOSIS — E119 Type 2 diabetes mellitus without complications: Secondary | ICD-10-CM | POA: Diagnosis not present

## 2023-09-01 DIAGNOSIS — M7061 Trochanteric bursitis, right hip: Secondary | ICD-10-CM | POA: Diagnosis not present

## 2024-01-28 DIAGNOSIS — S42252A Displaced fracture of greater tuberosity of left humerus, initial encounter for closed fracture: Secondary | ICD-10-CM | POA: Diagnosis not present

## 2024-01-28 DIAGNOSIS — M25512 Pain in left shoulder: Secondary | ICD-10-CM | POA: Diagnosis not present

## 2024-01-28 DIAGNOSIS — Z23 Encounter for immunization: Secondary | ICD-10-CM | POA: Diagnosis not present

## 2024-02-04 DIAGNOSIS — M25512 Pain in left shoulder: Secondary | ICD-10-CM | POA: Diagnosis not present

## 2024-02-04 DIAGNOSIS — S42252A Displaced fracture of greater tuberosity of left humerus, initial encounter for closed fracture: Secondary | ICD-10-CM | POA: Diagnosis not present

## 2024-02-12 DIAGNOSIS — M25512 Pain in left shoulder: Secondary | ICD-10-CM | POA: Diagnosis not present

## 2024-02-15 DIAGNOSIS — S42252D Displaced fracture of greater tuberosity of left humerus, subsequent encounter for fracture with routine healing: Secondary | ICD-10-CM | POA: Diagnosis not present

## 2024-02-15 DIAGNOSIS — S42215D Unspecified nondisplaced fracture of surgical neck of left humerus, subsequent encounter for fracture with routine healing: Secondary | ICD-10-CM | POA: Diagnosis not present

## 2024-02-29 DIAGNOSIS — S42252A Displaced fracture of greater tuberosity of left humerus, initial encounter for closed fracture: Secondary | ICD-10-CM | POA: Diagnosis not present

## 2024-03-03 DIAGNOSIS — Z0000 Encounter for general adult medical examination without abnormal findings: Secondary | ICD-10-CM | POA: Diagnosis not present

## 2024-03-03 DIAGNOSIS — R7303 Prediabetes: Secondary | ICD-10-CM | POA: Diagnosis not present

## 2024-03-03 DIAGNOSIS — E785 Hyperlipidemia, unspecified: Secondary | ICD-10-CM | POA: Diagnosis not present

## 2024-03-03 DIAGNOSIS — Z1159 Encounter for screening for other viral diseases: Secondary | ICD-10-CM | POA: Diagnosis not present

## 2024-03-03 DIAGNOSIS — Z1331 Encounter for screening for depression: Secondary | ICD-10-CM | POA: Diagnosis not present

## 2024-03-03 DIAGNOSIS — N951 Menopausal and female climacteric states: Secondary | ICD-10-CM | POA: Diagnosis not present

## 2024-03-29 DIAGNOSIS — S42252D Displaced fracture of greater tuberosity of left humerus, subsequent encounter for fracture with routine healing: Secondary | ICD-10-CM | POA: Diagnosis not present

## 2024-04-06 DIAGNOSIS — Z1231 Encounter for screening mammogram for malignant neoplasm of breast: Secondary | ICD-10-CM | POA: Diagnosis not present

## 2024-04-06 DIAGNOSIS — Z6822 Body mass index (BMI) 22.0-22.9, adult: Secondary | ICD-10-CM | POA: Diagnosis not present

## 2024-04-06 DIAGNOSIS — Z01419 Encounter for gynecological examination (general) (routine) without abnormal findings: Secondary | ICD-10-CM | POA: Diagnosis not present
